# Patient Record
Sex: Female | Born: 1950 | Race: White | Hispanic: No | Marital: Single | Smoking: Never smoker
Health system: Southern US, Community
[De-identification: ages and names within clinical notes are randomized; demographics above are authoritative.]

## PROBLEM LIST (undated history)

## (undated) DIAGNOSIS — B009 Herpesviral infection, unspecified: Secondary | ICD-10-CM

## (undated) DIAGNOSIS — I341 Nonrheumatic mitral (valve) prolapse: Secondary | ICD-10-CM

## (undated) DIAGNOSIS — I10 Essential (primary) hypertension: Secondary | ICD-10-CM

## (undated) DIAGNOSIS — R42 Dizziness and giddiness: Secondary | ICD-10-CM

## (undated) HISTORY — PX: CHOLECYSTECTOMY: SHX55

## (undated) HISTORY — PX: ABDOMINAL HYSTERECTOMY: SHX81

## (undated) HISTORY — PX: TONSILLECTOMY: SUR1361

## (undated) HISTORY — PX: RHINOPLASTY: SUR1284

---

## 2015-08-30 ENCOUNTER — Emergency Department (HOSPITAL_BASED_OUTPATIENT_CLINIC_OR_DEPARTMENT_OTHER): Payer: Medicare Other

## 2015-08-30 ENCOUNTER — Emergency Department (HOSPITAL_BASED_OUTPATIENT_CLINIC_OR_DEPARTMENT_OTHER)
Admission: EM | Admit: 2015-08-30 | Discharge: 2015-08-30 | Disposition: A | Discharge status: Home / Self Care | Payer: Medicare Other | Attending: Emergency Medicine | Admitting: Emergency Medicine

## 2015-08-30 ENCOUNTER — Encounter (HOSPITAL_BASED_OUTPATIENT_CLINIC_OR_DEPARTMENT_OTHER): Payer: Self-pay

## 2015-08-30 DIAGNOSIS — R112 Nausea with vomiting, unspecified: Secondary | ICD-10-CM | POA: Diagnosis present

## 2015-08-30 DIAGNOSIS — R1013 Epigastric pain: Secondary | ICD-10-CM | POA: Diagnosis not present

## 2015-08-30 DIAGNOSIS — R079 Chest pain, unspecified: Secondary | ICD-10-CM | POA: Diagnosis not present

## 2015-08-30 DIAGNOSIS — Z79899 Other long term (current) drug therapy: Secondary | ICD-10-CM | POA: Diagnosis not present

## 2015-08-30 DIAGNOSIS — I10 Essential (primary) hypertension: Secondary | ICD-10-CM | POA: Insufficient documentation

## 2015-08-30 DIAGNOSIS — R197 Diarrhea, unspecified: Secondary | ICD-10-CM | POA: Diagnosis not present

## 2015-08-30 HISTORY — DX: Nonrheumatic mitral (valve) prolapse: I34.1

## 2015-08-30 HISTORY — DX: Herpesviral infection, unspecified: B00.9

## 2015-08-30 HISTORY — DX: Essential (primary) hypertension: I10

## 2015-08-30 LAB — CBC WITH DIFFERENTIAL/PLATELET
Basophils Absolute: 0 10*3/uL (ref 0.0–0.1)
Basophils Relative: 0 %
Eosinophils Absolute: 0 10*3/uL (ref 0.0–0.7)
Eosinophils Relative: 0 %
HCT: 37.6 % (ref 36.0–46.0)
Hemoglobin: 13.1 g/dL (ref 12.0–15.0)
Lymphocytes Relative: 9 %
Lymphs Abs: 0.7 10*3/uL (ref 0.7–4.0)
MCH: 32.3 pg (ref 26.0–34.0)
MCHC: 34.8 g/dL (ref 30.0–36.0)
MCV: 92.8 fL (ref 78.0–100.0)
Monocytes Absolute: 0.3 10*3/uL (ref 0.1–1.0)
Monocytes Relative: 5 %
Neutro Abs: 6.3 10*3/uL (ref 1.7–7.7)
Neutrophils Relative %: 86 %
Platelets: 229 10*3/uL (ref 150–400)
RBC: 4.05 MIL/uL (ref 3.87–5.11)
RDW: 12.3 % (ref 11.5–15.5)
WBC: 7.3 10*3/uL (ref 4.0–10.5)

## 2015-08-30 LAB — COMPREHENSIVE METABOLIC PANEL
ALT: 86 U/L — ABNORMAL HIGH (ref 14–54)
AST: 62 U/L — ABNORMAL HIGH (ref 15–41)
Albumin: 4.2 g/dL (ref 3.5–5.0)
Alkaline Phosphatase: 74 U/L (ref 38–126)
Anion gap: 9 (ref 5–15)
BUN: 17 mg/dL (ref 6–20)
CO2: 27 mmol/L (ref 22–32)
Calcium: 8.6 mg/dL — ABNORMAL LOW (ref 8.9–10.3)
Chloride: 99 mmol/L — ABNORMAL LOW (ref 101–111)
Creatinine, Ser: 0.66 mg/dL (ref 0.44–1.00)
GFR calc Af Amer: >60 mL/min (ref >60)
GFR calc non Af Amer: >60 mL/min (ref >60)
Glucose, Bld: 165 mg/dL — ABNORMAL HIGH (ref 65–99)
Potassium: 4 mmol/L (ref 3.5–5.1)
Sodium: 135 mmol/L (ref 135–145)
Total Bilirubin: 0.9 mg/dL (ref 0.3–1.2)
Total Protein: 7.3 g/dL (ref 6.5–8.1)

## 2015-08-30 LAB — LIPASE, BLOOD: Lipase: 18 U/L (ref 11–51)

## 2015-08-30 MED ORDER — SODIUM CHLORIDE 0.9 % IV BOLUS (SEPSIS)
1000.0000 mL | Freq: Once | INTRAVENOUS | Status: AC
  Administered 2015-08-30: 1000 mL via INTRAVENOUS

## 2015-08-30 MED ORDER — DICYCLOMINE HCL 20 MG PO TABS: 20.0000 mg | ORAL_TABLET | Freq: Two times a day (BID) | ORAL | 0 refills | Status: AC

## 2015-08-30 MED ORDER — ONDANSETRON HCL 4 MG PO TABS: 4.0000 mg | ORAL_TABLET | Freq: Four times a day (QID) | ORAL | 0 refills | Status: AC

## 2015-08-30 MED ORDER — ONDANSETRON HCL 4 MG/2ML IJ SOLN
4.0000 mg | Freq: Once | INTRAMUSCULAR | Status: AC
  Administered 2015-08-30: 4 mg via INTRAVENOUS
  Filled 2015-08-30: qty 2

## 2015-08-30 NOTE — ED Provider Notes (Signed)
Emergency Department Provider Note  By signing my name below, I, [name], attest that this documentation has been prepared under the direction and in the presence of Maia PlanJoshua G Long, MD. Electronically Signed: Angelene GiovanniEmmanuella Mensah, ED Scribe. 08/30/15. 6:49 PM.   Maia PlanJoshua G Long, MD has reviewed the triage vital signs and the nursing notes.   HISTORY  Chief Complaint Emesis  HPI Comments: [name] is a 65 y.o. female with a hx of hypertension and Herpes who presents to the Emergency Department complaining of multiple episodes of moderate non-bloody vomiting and non-bloody diarrhea onset 11:30 pm last night. She reports associated chest pain, epigastric pain, and intermittent episodes of chills. She adds that although she has not vomited for approx 6 hours she has ongoing nausea. She notes that while vomiting, she felt as though "she was pulling muscles". Last episode of diarrhea was here in the ED.  No alleviating factors noted. Pt states that she has tried oral rehydrating solutions with no relief. She reports known sick contacts at work with similar symptoms. She denies any fever, urinary symptoms, shortness of breath, or any generalized rash.  Past Medical History:  Diagnosis Date  . Herpes   . Hypertension   . MVP (mitral valve prolapse)     There are no active problems to display for this patient.   Past Surgical History:  Procedure Laterality Date  . ABDOMINAL HYSTERECTOMY    . CESAREAN SECTION    . CHOLECYSTECTOMY    . RHINOPLASTY    . TONSILLECTOMY      Current Outpatient Rx  . Order #: [id number] Class: Historical Med  . Order #: [id number] Class: Historical Med  . Order #: [id number] Class: Historical Med  . Order #: [id number] Class: Print  . Order #: [id number] Class: Print    Allergies Benadryl [diphenhydramine hcl (sleep)]; Codeine; Gentamicin; Penicillins; and Sulfa antibiotics  No family history on file.  Social History Social History  Substance Use  Topics  . Smoking status: Never Smoker  . Smokeless tobacco: Never Used  . Alcohol use Yes     Comment: rare    Review of Systems Constitutional: No fever. Chills Eyes: No visual changes. ENT: No sore throat. Cardiovascular: Chest pain. Respiratory: Denies shortness of breath. Gastrointestinal: Abdominal pain.  Nausea and vomiting.  Diarrhea.  No constipation. Genitourinary: Negative for dysuria. Musculoskeletal: Negative for back pain. Skin: Negative for rash. Neurological: Negative for headaches, focal weakness or numbness.  10-point ROS otherwise negative.  ____________________________________________   PHYSICAL EXAM:  VITAL SIGNS: ED Triage Vitals [08/30/15 1720]  Enc Vitals Group     BP 151/74     Pulse Rate 100     Resp 20     Temp 99.1 F (37.3 C)     Temp Source Oral     SpO2 99 %     Weight 171 lb (77.6 kg)     Height 5\' 4"  (1.626 m)     Pain Score 7   Constitutional: Alert and oriented. Well appearing and in no acute distress. Eyes: Conjunctivae are normal. PERRL. Head: Atraumatic. Nose: No congestion/rhinnorhea. Mouth/Throat: Mucous membranes are moist.  Oropharynx non-erythematous. Neck: No stridor.  Cardiovascular: Normal rate, regular rhythm. Good peripheral circulation. Grossly normal heart sounds.   Respiratory: Normal respiratory effort.  No retractions. Lungs CTAB. Gastrointestinal: Soft and nontender. No distention.  Musculoskeletal: No lower extremity tenderness nor edema. No gross deformities of extremities. Neurologic:  Normal speech and language. No gross focal neurologic deficits are appreciated.  Skin:  Skin  is warm, dry and intact. No rash noted. Psychiatric: Mood and affect are normal. Speech and behavior are normal.  ____________________________________________ DIAGNOSTIC STUDIES: Oxygen Saturation is 99% on RA, normal by my interpretation.    COORDINATION OF CARE: 6:47 PM- Pt advised of plan for treatment and pt agrees. Pt will  receive lab work and chest x-ray for further evaluation. She will also receive IV fluids and Zofran.    LABS (all labs ordered are listed, but only abnormal results are displayed)  Labs Reviewed  COMPREHENSIVE METABOLIC PANEL - Abnormal; Notable for the following:       Result Value   Chloride 99 (*)    Glucose, Bld 165 (*)    Calcium 8.6 (*)    AST 62 (*)    ALT 86 (*)    All other components within normal limits  LIPASE, BLOOD  CBC WITH DIFFERENTIAL/PLATELET   ____________________________________________  RADIOLOGY  Dg Chest 2 View  Result Date: 08/30/2015 CLINICAL DATA:  Vomiting and mid back pain. EXAM: CHEST  2 VIEW COMPARISON:  None. FINDINGS: The heart, hila, and mediastinum are unremarkable. Mild atherosclerotic change in the thoracic aorta. No pneumothorax. Oval density projected posteriorly on the lateral view only measures up to 2.6 cm. This finding projects over two mid thoracic vertebral bodies. No other nodules or masses. No suspicious infiltrates. IMPRESSION: There is a 2.6 mm nodular oval density projected over the mid thoracic spine on the lateral view. While it is possible this could represent confluence of shadows or be bony in origin, a pulmonary nodule is not excluded. Recommend comparison to outside films if available. If no outside or previous studies are available, recommend a CT scan to exclude an abnormality in this region. Electronically Signed   By: Gerome Samavid  Williams III M.D   On: 08/30/2015 20:01   ____________________________________________   PROCEDURES  Procedure(s) performed:   Procedures  None ____________________________________________   INITIAL IMPRESSION / ASSESSMENT AND PLAN / ED COURSE  Pertinent labs & imaging results that were available during my care of the patient were reviewed by me and considered in my medical decision making (see chart for details).  Patient resents to the emergency room in for evaluation of multiple episodes of  vomiting, epigastric and lower chest discomfort, and diarrhea. Patient has exposure to any known sick contact with similar symptoms. No recorded fevers. Abdomen is soft and non-tender. No indication for further imaging at this time. Plan for labs, chest x-ray with some chest discomfort to rule out mediastinal air, IV fluids and nausea medication. Plan for PO challenge and discharge home with supportive care. Very low suspicion for atypical ACS with associated diarrhea and significant vomiting in the setting of known sick contact.    08:51PM  Patient is tolerating PO. She is feeling much better. Plan for discharge home with Zofran and strict return precautions.   At this time, I do not feel there is any life-threatening condition present. I have reviewed and discussed all results (EKG, imaging, lab, urine as appropriate), exam findings with patient. I have reviewed nursing notes and appropriate previous records.  I feel the patient is safe to be discharged home without further emergent workup. Discussed usual and customary return precautions. Patient and family (if present) verbalize understanding and are comfortable with this plan.  Patient will follow-up with their primary care provider. If they do not have a primary care provider, information for follow-up has been provided to them. All questions have been answered.  ____________________________________________  FINAL CLINICAL  IMPRESSION(S) / ED DIAGNOSES  Final diagnoses:  Nausea vomiting and diarrhea     MEDICATIONS GIVEN DURING THIS VISIT:  Medications  sodium chloride 0.9 % bolus 1,000 mL (0 mLs Intravenous Stopped 08/30/15 2015)  ondansetron (ZOFRAN) injection 4 mg (4 mg Intravenous Given 08/30/15 1907)     NEW OUTPATIENT MEDICATIONS STARTED DURING THIS VISIT:  Discharge Medication List as of 08/30/2015  8:53 PM    START taking these medications   Details  dicyclomine (BENTYL) 20 MG tablet Take 1 tablet (20 mg total) by mouth 2  (two) times daily., Starting Fri 08/30/2015, Print    ondansetron (ZOFRAN) 4 MG tablet Take 1 tablet (4 mg total) by mouth every 6 (six) hours., Starting Fri 08/30/2015, Print       Documentation performed with assistance of scribe. I reviewed the documentation for accuracy and made changes as necessary.   Note:  This document was prepared using Dragon voice recognition software and may include unintentional dictation errors.  Alona Bene, MD Emergency Medicine    [name], MD 08/31/15 [phone]

## 2015-08-30 NOTE — ED Triage Notes (Signed)
C/o n/v/d since 1130pm-positive contact last week with person with same c/o-NAD-slow steady gait

## 2015-08-30 NOTE — Discharge Instructions (Signed)

## 2015-08-30 NOTE — ED Notes (Signed)
Pt was given ice chips

## 2015-08-30 NOTE — ED Notes (Signed)
MD at bedside. 

## 2015-09-16 ENCOUNTER — Emergency Department (HOSPITAL_BASED_OUTPATIENT_CLINIC_OR_DEPARTMENT_OTHER)
Admission: EM | Admit: 2015-09-16 | Discharge: 2015-09-16 | Disposition: A | Discharge status: Home / Self Care | Payer: Medicare Other | Attending: Emergency Medicine | Admitting: Emergency Medicine

## 2015-09-16 ENCOUNTER — Emergency Department (HOSPITAL_BASED_OUTPATIENT_CLINIC_OR_DEPARTMENT_OTHER): Payer: Medicare Other

## 2015-09-16 ENCOUNTER — Encounter (HOSPITAL_BASED_OUTPATIENT_CLINIC_OR_DEPARTMENT_OTHER): Payer: Self-pay | Admitting: Emergency Medicine

## 2015-09-16 DIAGNOSIS — I10 Essential (primary) hypertension: Secondary | ICD-10-CM | POA: Diagnosis not present

## 2015-09-16 DIAGNOSIS — R0789 Other chest pain: Secondary | ICD-10-CM | POA: Insufficient documentation

## 2015-09-16 LAB — BASIC METABOLIC PANEL
Anion gap: 10 (ref 5–15)
BUN: 19 mg/dL (ref 6–20)
CO2: 25 mmol/L (ref 22–32)
Calcium: 9.3 mg/dL (ref 8.9–10.3)
Chloride: 99 mmol/L — ABNORMAL LOW (ref 101–111)
Creatinine, Ser: 0.73 mg/dL (ref 0.44–1.00)
GFR calc Af Amer: >60 mL/min (ref >60)
GFR calc non Af Amer: >60 mL/min (ref >60)
Glucose, Bld: 158 mg/dL — ABNORMAL HIGH (ref 65–99)
Potassium: 3.7 mmol/L (ref 3.5–5.1)
Sodium: 134 mmol/L — ABNORMAL LOW (ref 135–145)

## 2015-09-16 LAB — CBC WITH DIFFERENTIAL/PLATELET
Basophils Absolute: 0 10*3/uL (ref 0.0–0.1)
Basophils Relative: 0 %
Eosinophils Absolute: 0 10*3/uL (ref 0.0–0.7)
Eosinophils Relative: 0 %
HCT: 35.9 % — ABNORMAL LOW (ref 36.0–46.0)
Hemoglobin: 12.4 g/dL (ref 12.0–15.0)
Lymphocytes Relative: 20 %
Lymphs Abs: 2 10*3/uL (ref 0.7–4.0)
MCH: 32.2 pg (ref 26.0–34.0)
MCHC: 34.5 g/dL (ref 30.0–36.0)
MCV: 93.2 fL (ref 78.0–100.0)
Monocytes Absolute: 0.5 10*3/uL (ref 0.1–1.0)
Monocytes Relative: 5 %
Neutro Abs: 7.5 10*3/uL (ref 1.7–7.7)
Neutrophils Relative %: 75 %
Platelets: 323 10*3/uL (ref 150–400)
RBC: 3.85 MIL/uL — ABNORMAL LOW (ref 3.87–5.11)
RDW: 11.7 % (ref 11.5–15.5)
WBC: 10.1 10*3/uL (ref 4.0–10.5)

## 2015-09-16 LAB — I-STAT TROPONIN, ED: Troponin i, poc: 0 ng/mL (ref 0.00–0.08)

## 2015-09-16 LAB — TROPONIN I: Troponin I: <0.03 ng/mL (ref <0.03)

## 2015-09-16 MED ORDER — ASPIRIN 81 MG PO CHEW
324.0000 mg | CHEWABLE_TABLET | Freq: Once | ORAL | Status: AC
  Administered 2015-09-16: 324 mg via ORAL
  Filled 2015-09-16: qty 4

## 2015-09-16 MED ORDER — FAMOTIDINE IN NACL 20-0.9 MG/50ML-% IV SOLN
20.0000 mg | Freq: Once | INTRAVENOUS | Status: AC
  Administered 2015-09-16: 20 mg via INTRAVENOUS
  Filled 2015-09-16: qty 50

## 2015-09-16 MED ORDER — PANTOPRAZOLE SODIUM 40 MG PO TBEC
40.0000 mg | DELAYED_RELEASE_TABLET | Freq: Once | ORAL | Status: AC
  Administered 2015-09-16: 40 mg via ORAL
  Filled 2015-09-16: qty 1

## 2015-09-16 NOTE — ED Notes (Signed)
Pt back from xray, up to b/r, steady gait, denies pain or other sx.

## 2015-09-16 NOTE — ED Triage Notes (Signed)
Pt states she started having pressure in chest last night, went away and came back this evening.  Pt states she has had SOB, denies n/v

## 2015-09-16 NOTE — ED Notes (Signed)
Up to b/r, steady gait, VSS, denies questions or needs, denies sx, updated.

## 2015-09-16 NOTE — ED Notes (Signed)
Dr. Molpus into room 

## 2015-09-16 NOTE — ED Notes (Addendum)
Dr. Molpus into room, at BS. 

## 2015-09-16 NOTE — ED Provider Notes (Signed)
[facility]   CSN: [id number] Arrival date & time: 09/16/15  0026     History   Chief Complaint Chief Complaint  Patient presents with  . Chest Pain    HPI Autumn Norton is a 65 y.o. female with a history of hypertension. She is here with chest pressure that has been intermittent over the past 24 hours. It recurred about 8 PM yesterday evening. She describes it as a pressure or heartburn and not a frank pain. There is no radiation. She rates it as a 5 out of 10 at its worst. There was some mild associated shortness of breath but no nausea, vomiting or diaphoresis. She took an extra 25 milligrams of atenolol which relieved the pain. It subsequently returned but was relieved with Gaviscon. The discomfort was not worse with exertion or improved with rest. She is currently asymptomatic. She has been pain-free for over an hour.  HPI  Past Medical History:  Diagnosis Date  . Herpes   . Hypertension   . MVP (mitral valve prolapse)     There are no active problems to display for this patient.   Past Surgical History:  Procedure Laterality Date  . ABDOMINAL HYSTERECTOMY    . CESAREAN SECTION    . CHOLECYSTECTOMY    . RHINOPLASTY    . TONSILLECTOMY      OB History    No data available       Home Medications    Prior to Admission medications   Medication Sig Start Date End Date Taking? Authorizing Provider  ACYCLOVIR PO Take by mouth.    Historical Provider, MD  ATENOLOL PO Take by mouth.    Historical Provider, MD  dicyclomine (BENTYL) 20 MG tablet Take 1 tablet (20 mg total) by mouth 2 (two) times daily. 08/30/15   Maia PlanJoshua G Long, MD  LOSARTAN POTASSIUM PO Take by mouth.    Historical Provider, MD  ondansetron (ZOFRAN) 4 MG tablet Take 1 tablet (4 mg total) by mouth every 6 (six) hours. 08/30/15   Maia PlanJoshua G Long, MD    Family History History reviewed. No pertinent family history.  Social History Social History  Substance Use Topics  . Smoking  status: Never Smoker  . Smokeless tobacco: Never Used  . Alcohol use Yes     Comment: rare     Allergies   Benadryl [diphenhydramine hcl (sleep)]; Codeine; Gentamicin; Penicillins; and Sulfa antibiotics   Review of Systems Review of Systems  All other systems reviewed and are negative.    Physical Exam Updated Vital Signs BP 134/70   Pulse 72   Temp 98 F (36.7 C) (Oral)   Resp 14   Ht 5\' 4"  (1.626 m)   Wt 171 lb (77.6 kg)   SpO2 96%   BMI 29.35 kg/m   Physical Exam General: Well-developed, well-nourished female in no acute distress; appearance consistent with age of record HENT: normocephalic; atraumatic Eyes: pupils equal, round and reactive to light; extraocular muscles intact Neck: supple Heart: regular rate and rhythm; no murmurs, rubs or gallops Lungs: clear to auscultation bilaterally Abdomen: soft; nondistended; nontender; no masses or hepatosplenomegaly; bowel sounds present Extremities: No deformity; full range of motion; pulses normal Neurologic: Awake, alert and oriented; motor function intact in all extremities and symmetric; no facial droop Skin: Warm and dry Psychiatric: Normal mood and affect  RESULTS Nursing notes and vitals signs, including pulse oximetry, reviewed.  Summary of this visit's results, reviewed by myself:  Labs:  Results  for orders placed or performed during the hospital encounter of 09/16/15 (from the past 24 hour(s))  CBC with Differential/Platelet     Status: Abnormal   Collection Time: 09/16/15  1:50 AM  Result Value Ref Range   WBC 10.1 4.0 - 10.5 K/uL   RBC 3.85 (L) 3.87 - 5.11 MIL/uL   Hemoglobin 12.4 12.0 - 15.0 g/dL   HCT 16.1 (L) 09.6 - 04.5 %   MCV 93.2 78.0 - 100.0 fL   MCH 32.2 26.0 - 34.0 pg   MCHC 34.5 30.0 - 36.0 g/dL   RDW 40.9 81.1 - 91.4 %   Platelets 323 150 - 400 K/uL   Neutrophils Relative % 75 %   Neutro Abs 7.5 1.7 - 7.7 K/uL   Lymphocytes Relative 20 %   Lymphs Abs 2.0 0.7 - 4.0 K/uL   Monocytes  Relative 5 %   Monocytes Absolute 0.5 0.1 - 1.0 K/uL   Eosinophils Relative 0 %   Eosinophils Absolute 0.0 0.0 - 0.7 K/uL   Basophils Relative 0 %   Basophils Absolute 0.0 0.0 - 0.1 K/uL  Basic metabolic panel     Status: Abnormal   Collection Time: 09/16/15  1:50 AM  Result Value Ref Range   Sodium 134 (L) 135 - 145 mmol/L   Potassium 3.7 3.5 - 5.1 mmol/L   Chloride 99 (L) 101 - 111 mmol/L   CO2 25 22 - 32 mmol/L   Glucose, Bld 158 (H) 65 - 99 mg/dL   BUN 19 6 - 20 mg/dL   Creatinine, Ser 7.82 0.44 - 1.00 mg/dL   Calcium 9.3 8.9 - 95.6 mg/dL   GFR calc non Af Amer >60 >60 mL/min   GFR calc Af Amer >60 >60 mL/min   Anion gap 10 5 - 15  I-Stat Troponin, ED (not at [facility])     Status: None   Collection Time: 09/16/15  1:57 AM  Result Value Ref Range   Troponin i, poc 0.00 0.00 - 0.08 ng/mL   Comment 3          Troponin I     Status: None   Collection Time: 09/16/15  5:05 AM  Result Value Ref Range   Troponin I <0.03 <0.03 ng/mL    Imaging Studies: Dg Chest 2 View  Result Date: 09/16/2015 CLINICAL DATA:  Acute onset of generalized chest pressure. Initial encounter. EXAM: CHEST  2 VIEW COMPARISON:  Chest radiograph performed 08/30/2015 FINDINGS: The lungs are well-aerated and clear. There is no evidence of focal opacification, pleural effusion or pneumothorax. The heart is normal in size; the mediastinal contour is within normal limits. No acute osseous abnormalities are seen. Clips are noted within the right upper quadrant, reflecting prior cholecystectomy. IMPRESSION: No acute cardiopulmonary process seen. Electronically Signed   By: Roanna Raider M.D.   On: 09/16/2015 01:48    EKG  EKG Interpretation  Date/Time:  Monday September 16 2015 00:36:30 EDT Ventricular Rate:  61 PR Interval:  242 QRS Duration: 96 QT Interval:  388 QTC Calculation: 390 R Axis:   67 Text Interpretation:  Sinus rhythm with 1st degree A-V block Possible Anterior infarct , age undetermined Abnormal ECG  No previous ECGs available Confirmed by Makaylie Dedeaux  MD, Jonny Ruiz ([id number]) on 09/16/2015 12:56:35 AM      4:00 AM Patient still asymptomatic.  5:51 AM The patient has been asymptomatic since about midnight. Two troponins are negative. The patient was advised to contact her cardiologist, Dr. Willeen Cass, later this morning  Procedures (including critical care time)   Final Clinical Impressions(s) / ED Diagnoses   Final diagnoses:  Atypical chest pain      Paula Libra, MD 09/16/15 [phone]

## 2015-09-16 NOTE — ED Notes (Signed)
Pt alert, NAD, calm, interactive, resps e/u, speaking in clear complete sentences, no dyspnea noted, skin W&D, (denies: CP, sob, nausea or dizziness), states, previous sx have resolved.

## 2015-09-16 NOTE — ED Notes (Signed)
Pt to xray by w/c, no changes.

## 2017-08-13 ENCOUNTER — Other Ambulatory Visit: Payer: Self-pay | Admitting: Emergency Medicine

## 2017-08-13 DIAGNOSIS — N6489 Other specified disorders of breast: Secondary | ICD-10-CM

## 2017-08-18 ENCOUNTER — Ambulatory Visit
Admission: RE | Admit: 2017-08-18 | Discharge: 2017-08-18 | Disposition: A | Discharge status: Home / Self Care | Payer: Medicare Other | Source: Ambulatory Visit | Attending: Emergency Medicine | Admitting: Emergency Medicine

## 2017-08-18 DIAGNOSIS — N6489 Other specified disorders of breast: Secondary | ICD-10-CM

## 2018-01-18 ENCOUNTER — Other Ambulatory Visit: Payer: Self-pay | Admitting: Emergency Medicine

## 2018-01-18 DIAGNOSIS — R921 Mammographic calcification found on diagnostic imaging of breast: Secondary | ICD-10-CM

## 2018-03-02 ENCOUNTER — Other Ambulatory Visit: Payer: Self-pay | Admitting: Emergency Medicine

## 2018-03-03 ENCOUNTER — Ambulatory Visit
Admission: RE | Admit: 2018-03-03 | Discharge: 2018-03-03 | Disposition: A | Discharge status: Home / Self Care | Payer: Medicare Other | Source: Ambulatory Visit | Attending: Emergency Medicine | Admitting: Emergency Medicine

## 2018-03-03 ENCOUNTER — Other Ambulatory Visit: Payer: Self-pay | Admitting: Emergency Medicine

## 2018-03-03 DIAGNOSIS — R928 Other abnormal and inconclusive findings on diagnostic imaging of breast: Secondary | ICD-10-CM

## 2018-03-03 DIAGNOSIS — R921 Mammographic calcification found on diagnostic imaging of breast: Secondary | ICD-10-CM

## 2018-08-18 ENCOUNTER — Other Ambulatory Visit: Payer: Self-pay | Admitting: Emergency Medicine

## 2018-08-18 DIAGNOSIS — Z1231 Encounter for screening mammogram for malignant neoplasm of breast: Secondary | ICD-10-CM

## 2018-09-30 ENCOUNTER — Other Ambulatory Visit: Payer: Self-pay

## 2018-09-30 ENCOUNTER — Ambulatory Visit
Admission: RE | Admit: 2018-09-30 | Discharge: 2018-09-30 | Disposition: A | Discharge status: Home / Self Care | Payer: Medicare Other | Source: Ambulatory Visit | Attending: Emergency Medicine | Admitting: Emergency Medicine

## 2018-09-30 DIAGNOSIS — Z1231 Encounter for screening mammogram for malignant neoplasm of breast: Secondary | ICD-10-CM

## 2019-02-08 ENCOUNTER — Ambulatory Visit: Payer: Medicare Other

## 2019-02-18 ENCOUNTER — Ambulatory Visit: Payer: Medicare PPO | Attending: Internal Medicine

## 2019-02-18 DIAGNOSIS — Z23 Encounter for immunization: Secondary | ICD-10-CM

## 2019-02-18 NOTE — Progress Notes (Signed)
   Covid-19 Vaccination Clinic  Name:  Autumn Norton    MRN: [id number] DOB: September 16, 1950  02/18/2019  [name] was observed post Covid-19 immunization for 30 minutes based on pre-vaccination screening without incidence. She was provided with Vaccine Information Sheet and instruction to access the V-Safe system.   [name] was instructed to call 911 with any severe reactions post vaccine: Marland Kitchen Difficulty breathing  . Swelling of your face and throat  . A fast heartbeat  . A bad rash all over your body  . Dizziness and weakness    Immunizations Administered    Name Date Dose VIS Date Route   Pfizer COVID-19 Vaccine 02/18/2019  9:55 AM 0.3 mL 12/23/2018 Intramuscular   Manufacturer: [employer], [employer]   Lot: [id number]   NDC: [address]-7

## 2019-03-14 ENCOUNTER — Ambulatory Visit: Payer: Medicare PPO | Attending: Internal Medicine

## 2019-03-14 ENCOUNTER — Ambulatory Visit: Payer: Self-pay

## 2019-03-14 DIAGNOSIS — Z23 Encounter for immunization: Secondary | ICD-10-CM

## 2019-03-14 NOTE — Progress Notes (Signed)
   Covid-19 Vaccination Clinic  Name:  Maikayla Beggs    MRN: [id number] DOB: [birth year]  03/14/2019  Ms. Andrew was observed post Covid-19 immunization for 30 minutes based on pre-vaccination screening without incident. She was provided with Vaccine Information Sheet and instruction to access the V-Safe system.   Ms. Casad was instructed to call 911 with any severe reactions post vaccine: Marland Kitchen Difficulty breathing  . Swelling of face and throat  . A fast heartbeat  . A bad rash all over body  . Dizziness and weakness   Immunizations Administered    Name Date Dose VIS Date Route   Pfizer COVID-19 Vaccine 03/14/2019  1:55 PM 0.3 mL 12/23/2018 Intramuscular   Manufacturer: [employer], [employer]   Lot: [id number]   NDC: [address]-8

## 2019-06-03 ENCOUNTER — Emergency Department (HOSPITAL_COMMUNITY)
Admission: EM | Admit: 2019-06-03 | Discharge: 2019-06-03 | Disposition: A | Discharge status: Home / Self Care | Payer: Medicare PPO | Attending: Emergency Medicine | Admitting: Emergency Medicine

## 2019-06-03 ENCOUNTER — Encounter (HOSPITAL_COMMUNITY): Payer: Self-pay

## 2019-06-03 ENCOUNTER — Other Ambulatory Visit: Payer: Self-pay

## 2019-06-03 DIAGNOSIS — Z79899 Other long term (current) drug therapy: Secondary | ICD-10-CM | POA: Insufficient documentation

## 2019-06-03 DIAGNOSIS — I10 Essential (primary) hypertension: Secondary | ICD-10-CM | POA: Insufficient documentation

## 2019-06-03 DIAGNOSIS — M545 Low back pain, unspecified: Secondary | ICD-10-CM

## 2019-06-03 HISTORY — DX: Dizziness and giddiness: R42

## 2019-06-03 NOTE — ED Provider Notes (Signed)
[facility] EMERGENCY DEPARTMENT Provider Note   CSN: [id number] Arrival date & time: 06/03/19  1911     History Chief Complaint  Patient presents with  . Back Pain    Autumn Norton is a 69 y.o. female.  HPI 69 year old female with a history of mitral valve prolapse, hypertension presents to the ER with back pain after falling out of a hammock yesterday.  Patient states that she was sitting in a hammock when it broke, and she fell onto the lower metal bar under the hammock.  She was able to get up and walk without difficulty, but has been having progressive low back pain since her fall.  She has tried [employer], massage and OTC Tylenol with only mild relief.  She denies any numbness, tingling, weakness, foot drop, groin numbness, bowel bladder incontinence.  She denies hitting her head or LOC.    Past Medical History:  Diagnosis Date  . Herpes   . Hypertension   . MVP (mitral valve prolapse)   . Vertigo     There are no problems to display for this patient.   Past Surgical History:  Procedure Laterality Date  . ABDOMINAL HYSTERECTOMY    . CESAREAN SECTION    . CHOLECYSTECTOMY    . RHINOPLASTY    . TONSILLECTOMY       OB History   No obstetric history on file.     No family history on file.  Social History   Tobacco Use  . Smoking status: Never Smoker  . Smokeless tobacco: Never Used  Substance Use Topics  . Alcohol use: Yes    Comment: rare  . Drug use: No    Home Medications Prior to Admission medications   Medication Sig Start Date End Date Taking? Authorizing Provider  ACYCLOVIR PO Take by mouth.    [provider]  ATENOLOL PO Take by mouth.    [provider]  dicyclomine (BENTYL) 20 MG tablet Take 1 tablet (20 mg total) by mouth 2 (two) times daily. 08/30/15   Long, Arlyss Repress, MD  LOSARTAN POTASSIUM PO Take by mouth.    [provider]  ondansetron (ZOFRAN) 4 MG tablet Take 1 tablet (4 mg total) by  mouth every 6 (six) hours. 08/30/15   Long, Arlyss Repress, MD    Allergies    Benadryl [diphenhydramine hcl (sleep)], Codeine, Gentamicin, Penicillins, and Sulfa antibiotics  Review of Systems   Review of Systems  Musculoskeletal: Positive for back pain. Negative for gait problem, neck pain and neck stiffness.  Neurological: Negative for weakness, numbness and headaches.    Physical Exam Updated Vital Signs BP (!) 149/64 (BP Location: Right Arm)   Pulse 68   Temp 98.3 F (36.8 C) (Oral)   Resp 20   SpO2 99%   Physical Exam Vitals and nursing note reviewed.  Constitutional:      General: She is not in acute distress.    Appearance: She is well-developed.  HENT:     Head: Normocephalic and atraumatic.  Eyes:     Conjunctiva/sclera: Conjunctivae normal.  Cardiovascular:     Rate and Rhythm: Normal rate and regular rhythm.     Heart sounds: No murmur.  Pulmonary:     Effort: Pulmonary effort is normal. No respiratory distress.     Breath sounds: Normal breath sounds.  Abdominal:     Palpations: Abdomen is soft.     Tenderness: There is no abdominal tenderness.  Musculoskeletal:  General: Tenderness present. No swelling, deformity or signs of injury.     Cervical back: Neck supple.     Comments: No midline L-spine, T-spine, C-spine tenderness.  No noticeable crepitus, deformities or step-offs.  No evidence of bruising.  Mild left-sided paraspinal L-spine tenderness to palpation.  5/5 strength, normal range of motion, sensations intact in upper and lower extremities.  Skin:    General: Skin is warm and dry.     Findings: No bruising, erythema or rash.  Neurological:     Mental Status: She is alert.     ED Results / Procedures / Treatments   Labs (all labs ordered are listed, but only abnormal results are displayed) Labs Reviewed - No data to display  EKG None  Radiology No results found.  Procedures Procedures (including critical care time)  Medications  Ordered in ED Medications - No data to display  ED Course  I have reviewed the triage vital signs and the nursing notes.  Pertinent labs & imaging results that were available during my care of the patient were reviewed by me and considered in my medical decision making (see chart for details).    MDM Rules/Calculators/A&P                     69 year old female with low back pain after falling on a metal bar. Physical exam without midline tenderness, no noticeable step-offs, crepitus.  There is no evidence of bruising or any other deformities in her back.  She has some left-sided paraspinal tenderness.  Denies any foot drop, numbness, tingling, bowel bladder incontinence, groin numbness.  Suspect mild contusion to the left side after fall.   Do not think that any imaging is needed at this time.  Encouraged her to continue over-the-counter Tylenol or ibuprofen, icing the area, taking baths as needed.  If her symptoms not improve, encouraged her to follow-up with her PCP.  Strict return precautions given.  Low suspicion for fracture/ disk herniation at this time as the patient has been ambulatory and without red flag signs.  I explained this to the patient and she voices understanding and is agreeable.  At this stage in the ED course, the patient has been medically screened and is stable for discharge.   Final Clinical Impression(s) / ED Diagnoses Final diagnoses:  Acute left-sided low back pain without sciatica    Rx / DC Orders ED Discharge Orders    None       Lyndel Safe 06/03/19 2054    Malvin Johns, MD 06/03/19 2316

## 2019-06-03 NOTE — ED Triage Notes (Signed)
Pt reports that she was sitting on a hammock today and it broke, hit her lower back on the metal bar, pain is unrelieved by OTC meds, pt ambulatory

## 2019-06-03 NOTE — Discharge Instructions (Addendum)
Your work-up today in the ER was overall reassuring.  I do not think that you have any fractures or life-threatening causes of back pain.  I do not think you need an x-ray today as we discussed as you do not have any pain in the center of your back.  It is likely that you will have some bruising to the area after your fall.  Continue to take Tylenol or ibuprofen for pain, icing, gentle stretching.  Return to the ER if your symptoms worsen.  Please follow-up with your primary care provider if this does not improve.

## 2019-07-31 ENCOUNTER — Other Ambulatory Visit: Payer: Self-pay | Admitting: Family Medicine

## 2019-07-31 DIAGNOSIS — E2839 Other primary ovarian failure: Secondary | ICD-10-CM

## 2019-07-31 DIAGNOSIS — Z1231 Encounter for screening mammogram for malignant neoplasm of breast: Secondary | ICD-10-CM

## 2019-10-23 ENCOUNTER — Ambulatory Visit
Admission: RE | Admit: 2019-10-23 | Discharge: 2019-10-23 | Disposition: A | Discharge status: Home / Self Care | Payer: Medicare PPO | Source: Ambulatory Visit | Attending: Family Medicine | Admitting: Family Medicine

## 2019-10-23 ENCOUNTER — Ambulatory Visit: Payer: Medicare PPO

## 2019-10-23 ENCOUNTER — Other Ambulatory Visit: Payer: Self-pay

## 2019-10-23 DIAGNOSIS — E2839 Other primary ovarian failure: Secondary | ICD-10-CM

## 2019-12-01 ENCOUNTER — Ambulatory Visit: Payer: Medicare PPO

## 2019-12-04 ENCOUNTER — Ambulatory Visit
Admission: RE | Admit: 2019-12-04 | Discharge: 2019-12-04 | Disposition: A | Discharge status: Home / Self Care | Payer: Medicare PPO | Source: Ambulatory Visit | Attending: Family Medicine | Admitting: Family Medicine

## 2019-12-04 ENCOUNTER — Other Ambulatory Visit: Payer: Self-pay

## 2019-12-04 DIAGNOSIS — Z1231 Encounter for screening mammogram for malignant neoplasm of breast: Secondary | ICD-10-CM

## 2020-05-07 ENCOUNTER — Ambulatory Visit: Payer: Medicare PPO

## 2020-05-14 ENCOUNTER — Ambulatory Visit: Payer: Medicare PPO

## 2020-05-21 ENCOUNTER — Ambulatory Visit: Payer: Medicare PPO

## 2020-05-28 ENCOUNTER — Other Ambulatory Visit: Payer: Self-pay

## 2020-05-28 ENCOUNTER — Encounter: Payer: Self-pay | Admitting: Dietician

## 2020-05-28 ENCOUNTER — Encounter: Payer: Medicare PPO | Attending: Family Medicine | Admitting: Dietician

## 2020-05-28 VITALS — Ht 64.0 in | Wt 166.9 lb

## 2020-05-28 DIAGNOSIS — E119 Type 2 diabetes mellitus without complications: Secondary | ICD-10-CM | POA: Diagnosis present

## 2020-05-28 NOTE — Progress Notes (Signed)
Diabetes Self-Management Education  Visit Type: First/Initial  Appt. Start Time: 0800 Appt. End Time: 0905  05/28/2020  [name], identified by name and date of birth, is a 70 y.o. female with a diagnosis of Diabetes: Type 2.   ASSESSMENT  Height 5\' 4"  (1.626 m), weight 166 lb 14.4 oz (75.7 kg). Body mass index is 28.65 kg/m.   Pt has history of prediabetes, HTN, and elevated LDL (141 mg/dL - ) that is intolerant of statin therapy. Pt has no previous nutrition education. Pt reports [place] a trip to [place] that was very stressful that their PCP believes may have elevated their blood sugar to the level of a diagnosis of diabetes. Pt has returned to counseling to help mitigate stress, states it has been beneficial. Pt reports their usual stress level is a 2/10, but was 10/10 during their trip. Stress level has lowered but is still higher than before, maybe 3-4/10.  Pt reports an intolerance to onions (migraine). Pt reports stopping consumption of SSB. Pt states they begun really enjoying eating vegetables, as well as lowering their consumption of meat. Pt eats more seafood now. Pt has a generally healthful diet, and regular meal pattern.   Diabetes Self-Management Education - 05/28/20 0817      Visit Information   Visit Type First/Initial      Initial Visit   Diabetes Type Type 2    Are you currently following a meal plan? No    Are you taking your medications as prescribed? Yes    Date Diagnosed march, 2022      Health Coping   How would you rate your overall health? Good      Psychosocial Assessment   Patient Belief/Attitude about Diabetes Motivated to manage diabetes    Self-care barriers None    Self-management support Doctor's office    Other persons present Patient    Patient Concerns Nutrition/Meal planning;Healthy Lifestyle    Special Needs None    Preferred Learning Style Visual    Learning Readiness Ready    How often do you need to have someone help  you when you read instructions, pamphlets, or other written materials from your doctor or pharmacy? 1 - Never    What is the last grade level you completed in school? Master's Degree      Pre-Education Assessment   Patient understands the diabetes disease and treatment process. Needs Instruction    Patient understands incorporating nutritional management into lifestyle. Needs Instruction    Patient undertands incorporating physical activity into lifestyle. Needs Instruction    Patient understands using medications safely. Needs Instruction    Patient understands monitoring blood glucose, interpreting and using results Needs Instruction    Patient understands prevention, detection, and treatment of acute complications. Needs Instruction    Patient understands prevention, detection, and treatment of chronic complications. Needs Instruction    Patient understands how to develop strategies to address psychosocial issues. Needs Instruction    Patient understands how to develop strategies to promote health/change behavior. Needs Instruction      Complications   Last HgB A1C per patient/outside source 6.8 %   03/29/2020   How often do you check your blood sugar? 0 times/day (not testing)    Have you had a dilated eye exam in the past 12 months? Yes    Have you had a dental exam in the past 12 months? Yes    Are you checking your feet? No      Dietary Intake  Breakfast Oatmeal with oat milk and sugar, 2 cups decaf black tea w/ oat milk    Snack (morning) none    Lunch 1/2 [employer] chicken salad w/ strawberries, mango, nuts, blueberries, mixed greens. side of bread, water    Snack (afternoon) none    Dinner 2 scrambled eggs in olive oil, slice of cheddar cheese, 2 pieces [employer] killer seed bread, strawberry jam, cup of decaf black tea w/ oatmilk    Snack (evening) 6 Cinnamon almonds    Beverage(s) oat milk, water, decaf tea      Exercise   Exercise Type ADL's    How many days per week to you  exercise? 3    How many minutes per day do you exercise? 30    Total minutes per week of exercise 90      Patient Education   Previous Diabetes Education No    Disease state  Factors that contribute to the development of diabetes;Definition of diabetes, type 1 and 2, and the diagnosis of diabetes    Nutrition management  Role of diet in the treatment of diabetes and the relationship between the three main macronutrients and blood glucose level;Food label reading, portion sizes and measuring food.    Physical activity and exercise  Role of exercise on diabetes management, blood pressure control and cardiac health.    Medications Reviewed patients medication for diabetes, action, purpose, timing of dose and side effects.    Monitoring Interpreting lab values - A1C, lipid, urine microalbumina.    Psychosocial adjustment Role of stress on diabetes   Very likely stress triggered onset of diabetes   Personal strategies to promote health Lifestyle issues that need to be addressed for better diabetes care      Individualized Goals (developed by patient)   Nutrition Follow meal plan discussed    Physical Activity Exercise 3-5 times per week    Medications take my medication as prescribed    Monitoring  Not Applicable    Health Coping ask for help with (comment)   continue counseling for stress reduction     Post-Education Assessment   Patient understands the diabetes disease and treatment process. Needs Review    Patient understands incorporating nutritional management into lifestyle. Needs Review    Patient undertands incorporating physical activity into lifestyle. Needs Review    Patient understands using medications safely. Needs Review    Patient understands monitoring blood glucose, interpreting and using results Needs Review    Patient understands prevention, detection, and treatment of acute complications. Needs Review    Patient understands prevention, detection, and treatment of chronic  complications. Needs Review    Patient understands how to develop strategies to address psychosocial issues. Needs Review    Patient understands how to develop strategies to promote health/change behavior. Needs Review      Outcomes   Expected Outcomes Demonstrated interest in learning. Expect positive outcomes    Future DMSE 4-6 wks    Program Status Not Completed           Individualized Plan for Diabetes Self-Management Training:   Learning Objective:  Patient will have a greater understanding of diabetes self-management. Patient education plan is to attend individual and/or group sessions per assessed needs and concerns.   Plan:   Patient Instructions  Work towards 150 minutes a week of physical activity, ideally 30 minutes a day, 5 days a week.  Consider adding protein powder or PB2 powdered peanut butter to your oatmeal, or adding a boiled egg  or fat free greek yogurt for breakfast.  When having snacks, aim for a balance of carbs and protein. Use your "Balanced Snack" sheet!  Continue eating three meals a day, about 5-6 hours apart!  Begin to recognize carbohydrates in your food choices!  Begin to build your meals using the proportions of the Balanced Plate. . First, select your carb choice(s) for the meal. . Next, select your source of protein to pair with your carb choice(s). . Finally, complete the remaining half of your meal with a variety of non-starchy vegetables.    Expected Outcomes:  Demonstrated interest in learning. Expect positive outcomes  Education material provided: My Plate and Snack sheet  If problems or questions, patient to contact team via:  Email  Future DSME appointment: 4-6 wks

## 2020-05-28 NOTE — Patient Instructions (Addendum)
Work towards 150 minutes a week of physical activity, ideally 30 minutes a day, 5 days a week.  Consider adding protein powder or PB2 powdered peanut butter to your oatmeal, or adding a boiled egg or fat free greek yogurt for breakfast.  When having snacks, aim for a balance of carbs and protein. Use your "Balanced Snack" sheet!  Continue eating three meals a day, about 5-6 hours apart!  Begin to recognize carbohydrates in your food choices!  Begin to build your meals using the proportions of the Balanced Plate. . First, select your carb choice(s) for the meal. . Next, select your source of protein to pair with your carb choice(s). . Finally, complete the remaining half of your meal with a variety of non-starchy vegetables.

## 2020-07-09 ENCOUNTER — Encounter: Payer: Self-pay | Admitting: Dietician

## 2020-07-09 ENCOUNTER — Other Ambulatory Visit: Payer: Self-pay

## 2020-07-09 ENCOUNTER — Encounter: Payer: Medicare PPO | Attending: Family Medicine | Admitting: Dietician

## 2020-07-09 VITALS — Ht 64.0 in | Wt 168.0 lb

## 2020-07-09 DIAGNOSIS — E119 Type 2 diabetes mellitus without complications: Secondary | ICD-10-CM | POA: Insufficient documentation

## 2020-07-09 NOTE — Progress Notes (Signed)
Diabetes Self-Management Education  Visit Type: Follow-up  Appt. Start Time: 1005 Appt. End Time: 1040  07/09/2020  [name], identified by name and date of birth, is a 70 y.o. female with a diagnosis of Diabetes: Type 2.   ASSESSMENT Pt received updated lab results while waiting in the lobby for our visit.  Pt is A1c is now 6.4.(previously 6.8 - 3/22) Pt LDL is now 125 (previously 141 - 3/22).  Pt states that following the balanced plate plan has helped them improve their A1c. Pt reports having protein with breakfast now, and carbs at each meal. Pt reports not having an energy drop in the mid-morning anymore. Pt has more energy overall. Pt does not crave sweets as much now. Pt has increased their portion size of vegetables at each meal.  Pt reports walking their dog in the morning and afternoon now, 20-30 minutes at a time. Pt reports a long term relationship has ended recently and will be moving to [place] near their kids. Pt states this is a positive experience and their stress levels are lowering.  Height 5\' 4"  (1.626 m), weight 168 lb (76.2 kg). Body mass index is 28.84 kg/m.   Diabetes Self-Management Education - 07/09/20 1027       Visit Information   Visit Type Follow-up      Initial Visit   Diabetes Type Type 2    Are you currently following a meal plan? Yes      Health Coping   How would you rate your overall health? Good      Complications   Last HgB A1C per patient/outside source 6.4 %   07/04/2020     Dietary Intake   Breakfast Whole grain waffle, blueberries and strawberries, little syrup, 1 pad of butter, bluberry yogurt, hot tea    Snack (morning) none    Lunch Chicken salad chick chicken salad sandwich with cranberries and almonds, mixed veggies, tea    Snack (afternoon) none    Dinner Halibut fish sticks, mixed vegetables, pita chips, water    Snack (evening) none    Beverage(s) Water, hot tea      Exercise   Exercise Type ADL's;Light (walking /  raking leaves)    How many days per week to you exercise? 6    How many minutes per day do you exercise? 50    Total minutes per week of exercise 300      Individualized Goals (developed by patient)   Nutrition Follow meal plan discussed    Physical Activity Exercise 5-7 days per week      Patient Self-Evaluation of Goals - Patient rates self as meeting previously set goals (% of time)   Nutrition >75%    Physical Activity >75%    Medications >75%    Monitoring Not Applicable    Problem Solving >75%    Reducing Risk >75%    Health Coping >75%      Post-Education Assessment   Patient understands the diabetes disease and treatment process. Demonstrates understanding / competency    Patient understands incorporating nutritional management into lifestyle. Demonstrates understanding / competency    Patient undertands incorporating physical activity into lifestyle. Demonstrates understanding / competency    Patient understands using medications safely. Demonstrates understanding / competency    Patient understands prevention, detection, and treatment of acute complications. Demonstrates understanding / competency    Patient understands prevention, detection, and treatment of chronic complications. Demonstrates understanding / competency    Patient understands how to  develop strategies to address psychosocial issues. Demonstrates understanding / competency    Patient understands how to develop strategies to promote health/change behavior. Demonstrates understanding / competency      Outcomes   Expected Outcomes Demonstrated interest in learning. Expect positive outcomes    Future DMSE 3-4 months    Program Status Not Completed      Subsequent Visit   Since your last visit have you continued or begun to take your medications as prescribed? Yes    Since your last visit have you had your blood pressure checked? No    Since your last visit have you experienced any weight changes? No change     Since your last visit, are you checking your blood glucose at least once a day? No             Individualized Plan for Diabetes Self-Management Training:   Learning Objective:  Patient will have a greater understanding of diabetes self-management. Patient education plan is to attend individual and/or group sessions per assessed needs and concerns.   Plan:   Patient Instructions  Use a combination of these three strategies to effectively lower your consumption of saturated fat  1) Consume a smaller portion when having land animal products. 2) Consume high fat meats/dairy less frequently 3) Find a reduced or low saturated fat version  Keep up the physical activity! You are helping to lower your blood sugar and increase your healthy cholesterol!  Continue to follow the balanced plate!  YOU ARE DOING AN AMAZING JOB!!   Expected Outcomes:  Demonstrated interest in learning. Expect positive outcomes  Education material provided: Saturated Fat Worksheet  If problems or questions, patient to contact team via:  Phone and Email  Future DSME appointment: 3-4 months

## 2020-07-09 NOTE — Patient Instructions (Signed)
Use a combination of these three strategies to effectively lower your consumption of saturated fat  1) Consume a smaller portion when having land animal products. 2) Consume high fat meats/dairy less frequently 3) Find a reduced or low saturated fat version  Keep up the physical activity! You are helping to lower your blood sugar and increase your healthy cholesterol!  Continue to follow the balanced plate!  YOU ARE DOING AN AMAZING JOB!!

## 2020-10-23 ENCOUNTER — Other Ambulatory Visit: Payer: Self-pay | Admitting: Family Medicine

## 2020-10-23 DIAGNOSIS — Z1231 Encounter for screening mammogram for malignant neoplasm of breast: Secondary | ICD-10-CM

## 2020-10-29 ENCOUNTER — Ambulatory Visit: Payer: Medicare PPO | Admitting: Dietician

## 2020-12-11 ENCOUNTER — Ambulatory Visit
Admission: RE | Admit: 2020-12-11 | Discharge: 2020-12-11 | Disposition: A | Discharge status: Home / Self Care | Payer: Medicare PPO | Source: Ambulatory Visit

## 2020-12-11 DIAGNOSIS — Z1231 Encounter for screening mammogram for malignant neoplasm of breast: Secondary | ICD-10-CM

## 2023-11-17 IMAGING — MG MM DIGITAL SCREENING BILAT W/ TOMO AND CAD
6 of 10 series · 6 of 30 positions shown · non-contrast
Comparison: Previous exam(s).

CLINICAL DATA: Screening.

EXAM:
DIGITAL SCREENING BILATERAL MAMMOGRAM WITH TOMOSYNTHESIS AND CAD
TECHNIQUE: Bilateral screening digital craniocaudal and mediolateral oblique
mammograms were obtained. Bilateral screening digital breast
tomosynthesis was performed. The images were evaluated with
computer-aided detection.

[R MLO synth-2D]
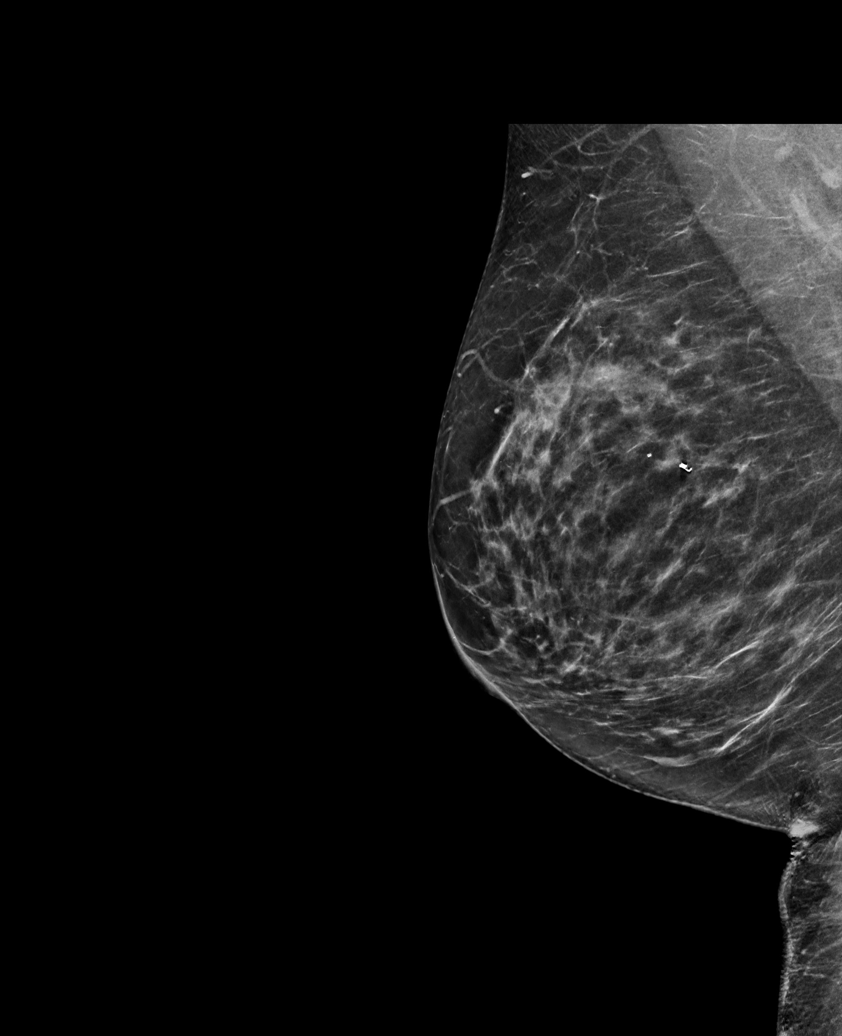

[L CC synth-2D]
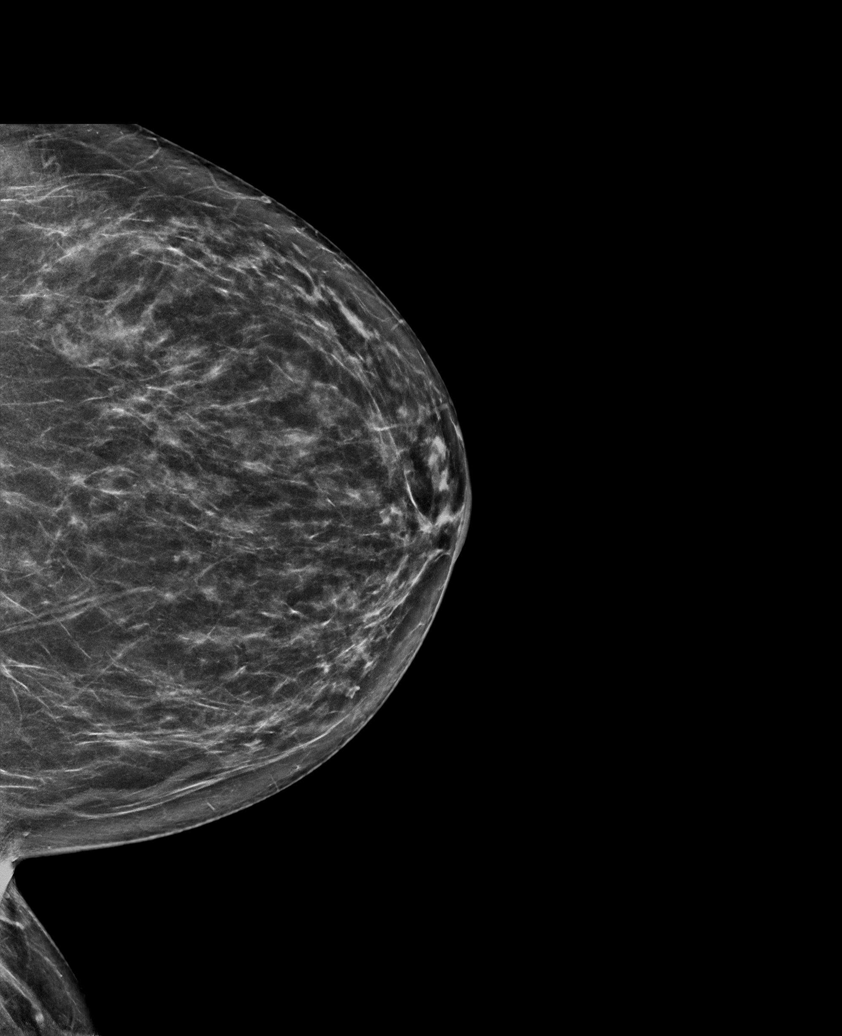

[R CC synth-2D (1 of 2)]
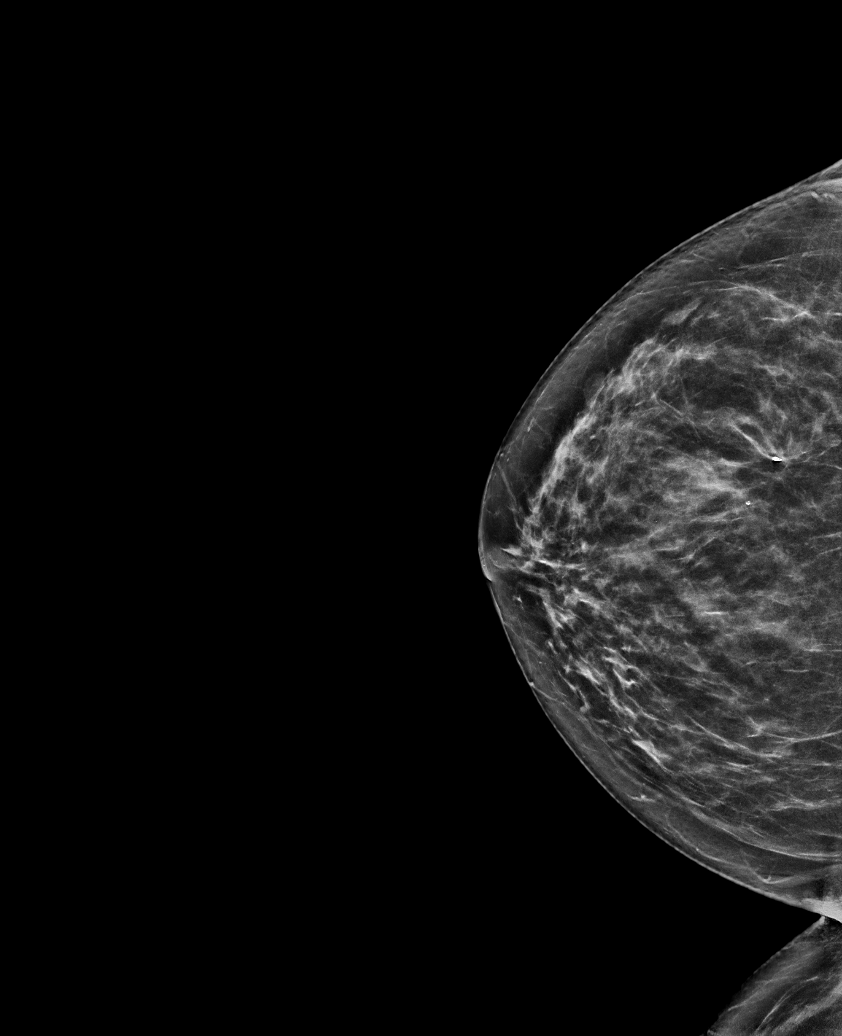

[L MLO synth-2D]
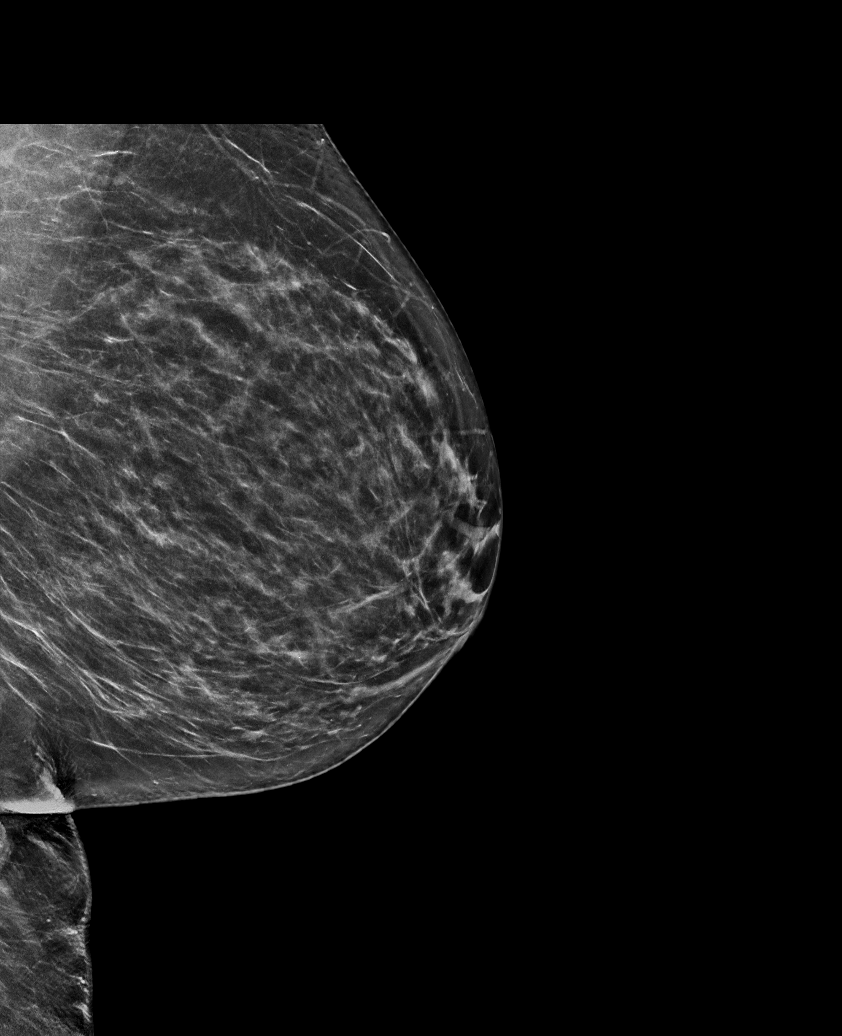

[R CC synth-2D (2 of 2)]
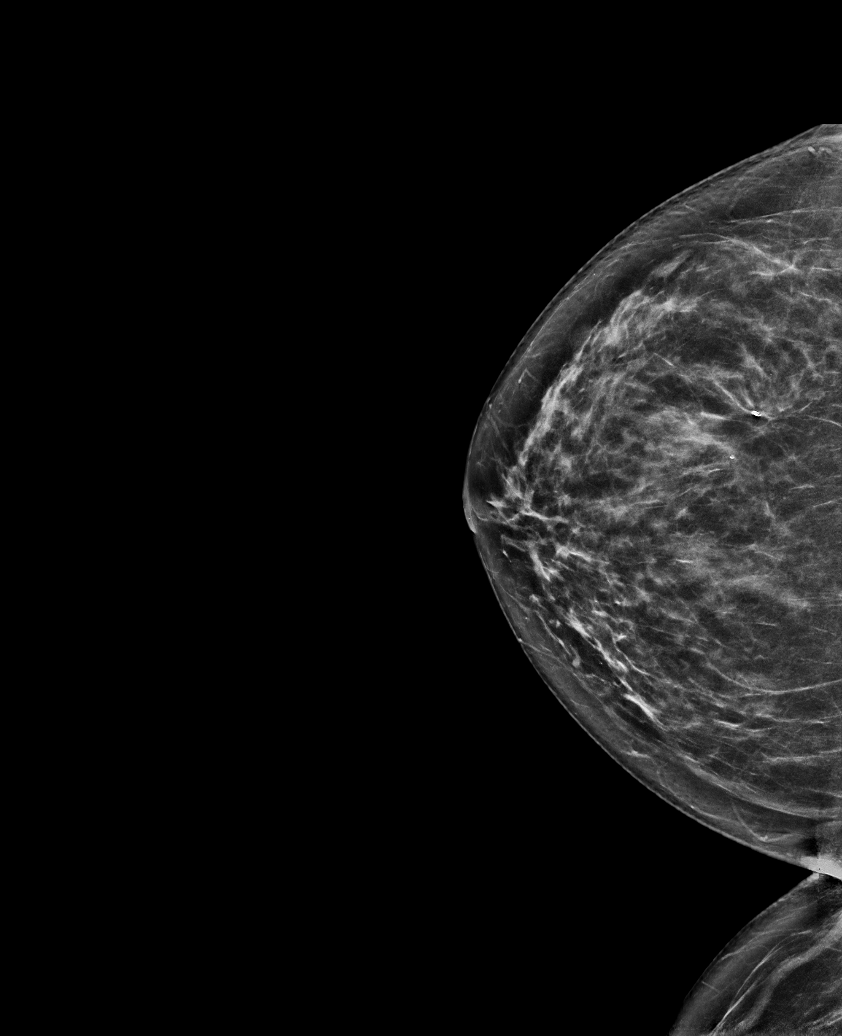

[L CC tomo · tomo slice 35/70.0]
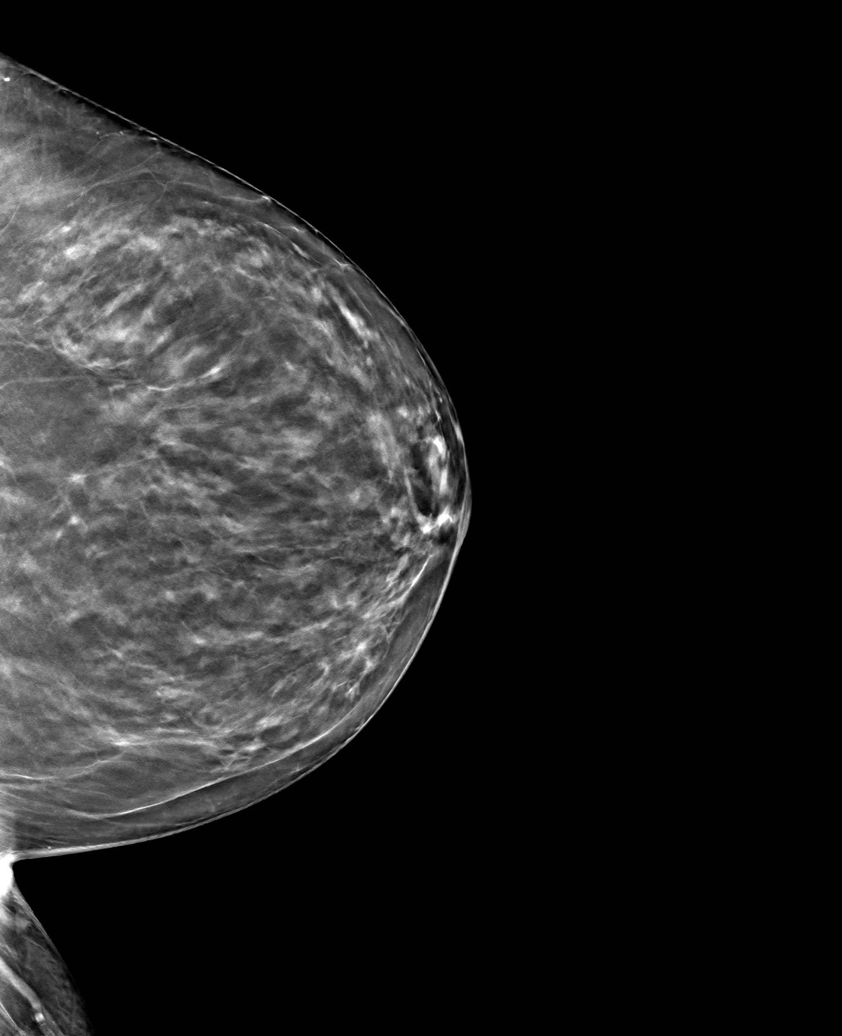

[6 of 30 positions shown; findings below may reference images not displayed]

ACR Breast Density Category b: There are scattered areas of
fibroglandular density.
FINDINGS: There are no findings suspicious for malignancy.
IMPRESSION: No mammographic evidence of malignancy. A result letter of this
screening mammogram will be mailed directly to the patient.

RECOMMENDATION:
Screening mammogram in one year. (Code:51-O-LD2)

BI-RADS CATEGORY  1: Negative.
